# Patient Record
Sex: Female | Born: 1980 | Race: Black or African American | Hispanic: No | Marital: Single | Smoking: Never smoker
Health system: Southern US, Community
[De-identification: ages and names within clinical notes are randomized; demographics above are authoritative.]

---

## 2014-08-13 DIAGNOSIS — S161XXA Strain of muscle, fascia and tendon at neck level, initial encounter: Secondary | ICD-10-CM | POA: Insufficient documentation

## 2014-08-13 DIAGNOSIS — E119 Type 2 diabetes mellitus without complications: Secondary | ICD-10-CM | POA: Diagnosis not present

## 2014-08-13 DIAGNOSIS — S0990XA Unspecified injury of head, initial encounter: Secondary | ICD-10-CM | POA: Diagnosis not present

## 2014-08-13 DIAGNOSIS — Y998 Other external cause status: Secondary | ICD-10-CM | POA: Diagnosis not present

## 2014-08-13 DIAGNOSIS — Y92410 Unspecified street and highway as the place of occurrence of the external cause: Secondary | ICD-10-CM | POA: Insufficient documentation

## 2014-08-13 DIAGNOSIS — S199XXA Unspecified injury of neck, initial encounter: Secondary | ICD-10-CM | POA: Diagnosis present

## 2014-08-13 DIAGNOSIS — Y9389 Activity, other specified: Secondary | ICD-10-CM | POA: Diagnosis not present

## 2014-08-14 ENCOUNTER — Emergency Department: Payer: BLUE CROSS/BLUE SHIELD

## 2014-08-14 ENCOUNTER — Encounter: Payer: Self-pay | Admitting: Emergency Medicine

## 2014-08-14 ENCOUNTER — Emergency Department
Admission: EM | Admit: 2014-08-14 | Discharge: 2014-08-14 | Disposition: A | Discharge status: Home / Self Care | Payer: BLUE CROSS/BLUE SHIELD | Attending: Emergency Medicine | Admitting: Emergency Medicine

## 2014-08-14 DIAGNOSIS — S161XXA Strain of muscle, fascia and tendon at neck level, initial encounter: Secondary | ICD-10-CM

## 2014-08-14 LAB — COMPREHENSIVE METABOLIC PANEL
ALBUMIN: 3.9 g/dL (ref 3.5–5.0)
ALT: 24 U/L (ref 14–54)
ANION GAP: 10 (ref 5–15)
AST: 24 U/L (ref 15–41)
Alkaline Phosphatase: 38 U/L (ref 38–126)
BUN: 14 mg/dL (ref 6–20)
CALCIUM: 8.9 mg/dL (ref 8.9–10.3)
CO2: 28 mmol/L (ref 22–32)
CREATININE: 0.97 mg/dL (ref 0.44–1.00)
Chloride: 101 mmol/L (ref 101–111)
GFR calc Af Amer: >60 mL/min (ref >60)
GFR calc non Af Amer: >60 mL/min (ref >60)
Glucose, Bld: 161 mg/dL — ABNORMAL HIGH (ref 65–99)
Potassium: 3.6 mmol/L (ref 3.5–5.1)
Sodium: 139 mmol/L (ref 135–145)
TOTAL PROTEIN: 7.6 g/dL (ref 6.5–8.1)
Total Bilirubin: 0.4 mg/dL (ref 0.3–1.2)

## 2014-08-14 LAB — CBC WITH DIFFERENTIAL/PLATELET
BASOS ABS: 0.1 10*3/uL (ref 0–0.1)
BASOS PCT: 1 %
EOS ABS: 0.4 10*3/uL (ref 0–0.7)
Eosinophils Relative: 4 %
HEMATOCRIT: 37.5 % (ref 35.0–47.0)
Hemoglobin: 12.3 g/dL (ref 12.0–16.0)
LYMPHS ABS: 2.4 10*3/uL (ref 1.0–3.6)
LYMPHS PCT: 26 %
MCH: 30.5 pg (ref 26.0–34.0)
MCHC: 32.9 g/dL (ref 32.0–36.0)
MCV: 92.7 fL (ref 80.0–100.0)
MONOS PCT: 8 %
Monocytes Absolute: 0.7 10*3/uL (ref 0.2–0.9)
Neutro Abs: 5.7 10*3/uL (ref 1.4–6.5)
Neutrophils Relative %: 61 %
Platelets: 220 10*3/uL (ref 150–440)
RBC: 4.05 MIL/uL (ref 3.80–5.20)
RDW: 14.9 % — AB (ref 11.5–14.5)
WBC: 9.2 10*3/uL (ref 3.6–11.0)

## 2014-08-14 MED ORDER — ONDANSETRON HCL 4 MG/2ML IJ SOLN
INTRAMUSCULAR | Status: AC
  Administered 2014-08-14: 4 mg via INTRAVENOUS
  Filled 2014-08-14: qty 2

## 2014-08-14 MED ORDER — MORPHINE SULFATE 4 MG/ML IJ SOLN
INTRAMUSCULAR | Status: AC
  Administered 2014-08-14: 4 mg via INTRAVENOUS
  Filled 2014-08-14: qty 1

## 2014-08-14 MED ORDER — MORPHINE SULFATE 4 MG/ML IJ SOLN
4.0000 mg | Freq: Once | INTRAMUSCULAR | Status: AC
  Administered 2014-08-14: 4 mg via INTRAVENOUS

## 2014-08-14 MED ORDER — KETOROLAC TROMETHAMINE 30 MG/ML IJ SOLN
INTRAMUSCULAR | Status: AC
  Administered 2014-08-14: 30 mg via INTRAVENOUS
  Filled 2014-08-14: qty 1

## 2014-08-14 MED ORDER — KETOROLAC TROMETHAMINE 30 MG/ML IJ SOLN
30.0000 mg | Freq: Once | INTRAMUSCULAR | Status: AC
  Administered 2014-08-14: 30 mg via INTRAVENOUS

## 2014-08-14 MED ORDER — ONDANSETRON HCL 4 MG/2ML IJ SOLN
4.0000 mg | Freq: Once | INTRAMUSCULAR | Status: AC
  Administered 2014-08-14: 4 mg via INTRAVENOUS

## 2014-08-14 MED ORDER — OXYCODONE-ACETAMINOPHEN 5-325 MG PO TABS: 1.0000 | ORAL_TABLET | ORAL | Status: AC | PRN

## 2014-08-14 MED ORDER — IBUPROFEN 800 MG PO TABS
800.0000 mg | ORAL_TABLET | Freq: Three times a day (TID) | ORAL | Status: AC | PRN
Start: 2014-08-14 — End: ?

## 2014-08-14 MED ORDER — SODIUM CHLORIDE 0.9 % IV BOLUS (SEPSIS)
1000.0000 mL | Freq: Once | INTRAVENOUS | Status: AC
  Administered 2014-08-14: 1000 mL via INTRAVENOUS
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
